# Patient Record
Sex: Male | Born: 1975 | Hispanic: No | Marital: Married
Health system: Southern US, Community
[De-identification: ages and names within clinical notes are randomized; demographics above are authoritative.]

---

## 2000-02-22 ENCOUNTER — Emergency Department (HOSPITAL_COMMUNITY): Admission: EM | Admit: 2000-02-22 | Discharge: 2000-02-22 | Payer: Self-pay | Admitting: Emergency Medicine

## 2000-02-22 ENCOUNTER — Encounter: Payer: Self-pay | Admitting: Emergency Medicine

## 2000-03-01 ENCOUNTER — Emergency Department (HOSPITAL_COMMUNITY): Admission: EM | Admit: 2000-03-01 | Discharge: 2000-03-01 | Payer: Self-pay | Admitting: Emergency Medicine

## 2008-02-23 ENCOUNTER — Inpatient Hospital Stay (HOSPITAL_COMMUNITY): Admission: EM | Admit: 2008-02-23 | Discharge: 2008-02-26 | Payer: Self-pay | Admitting: Emergency Medicine

## 2009-05-28 IMAGING — CR DG WRIST COMPLETE 3+V*R*
3 series · 3 of 3 positions shown · non-contrast
Comparison: No priors

CLINICAL DATA: Multiple trauma - right wrist pain

RIGHT WRIST - COMPLETE 3+ VIEW

[view not recorded (1 of 3)]
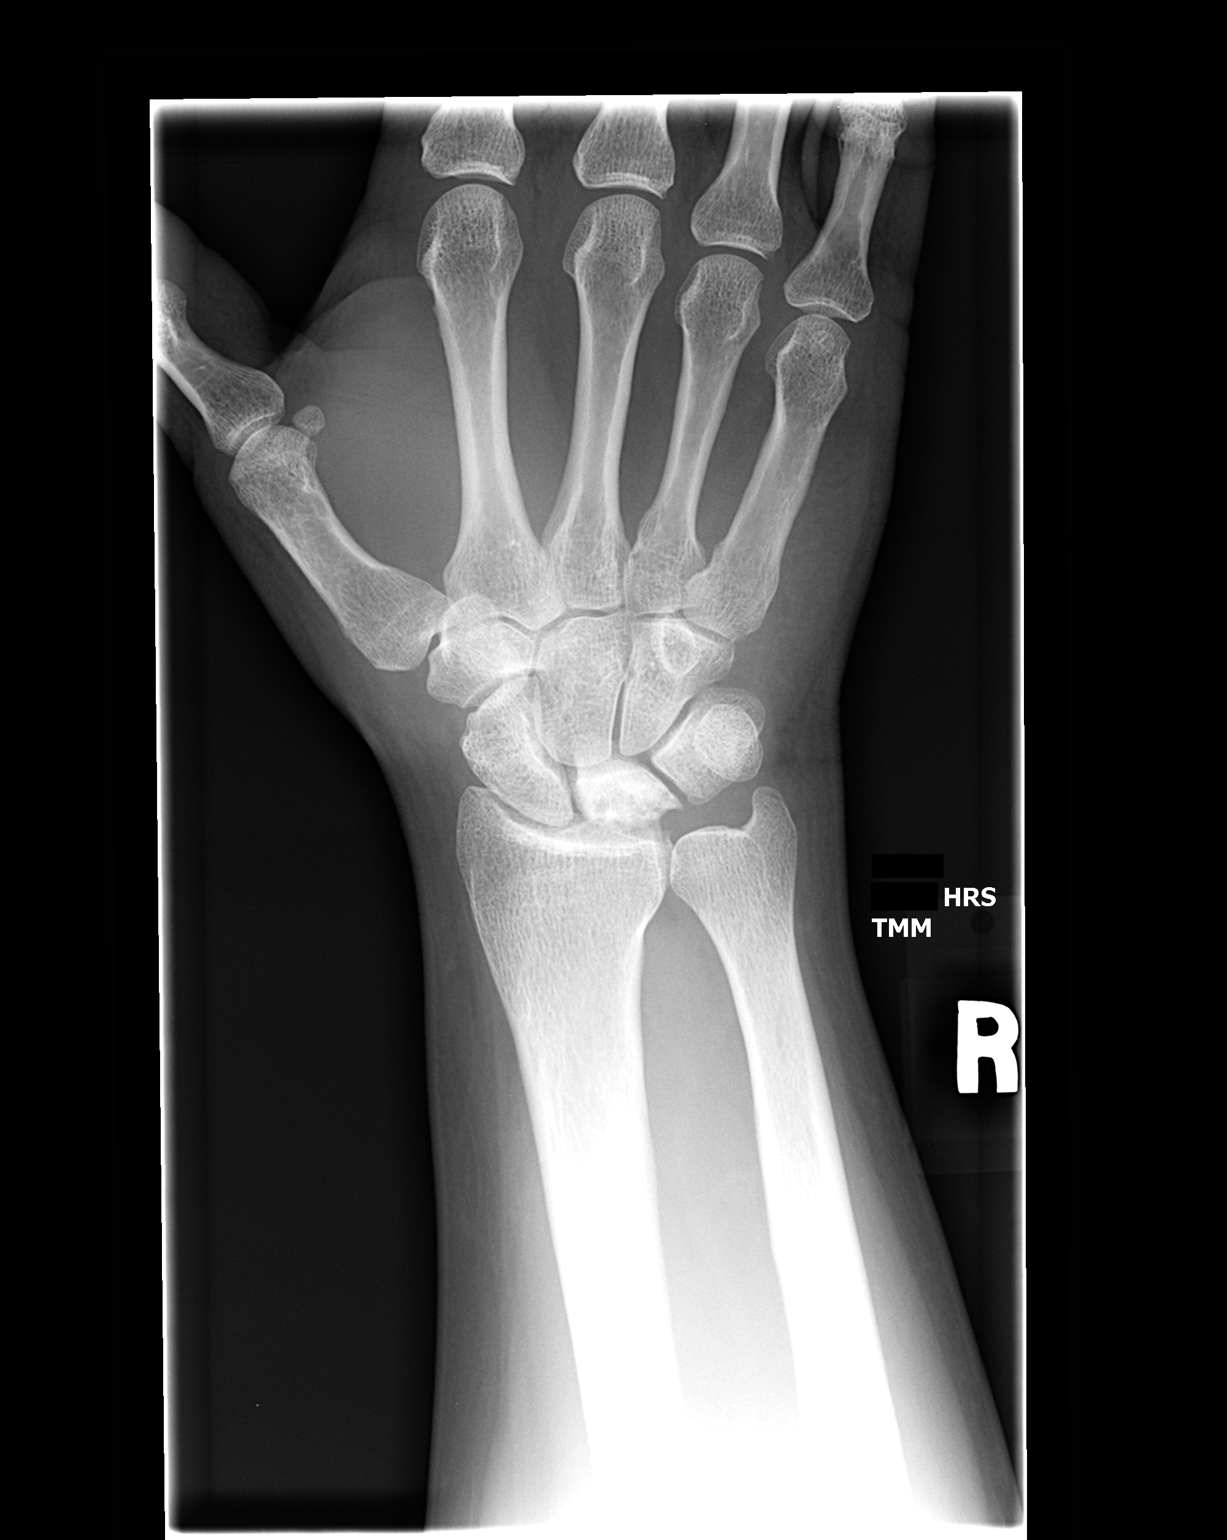

[view not recorded (2 of 3)]
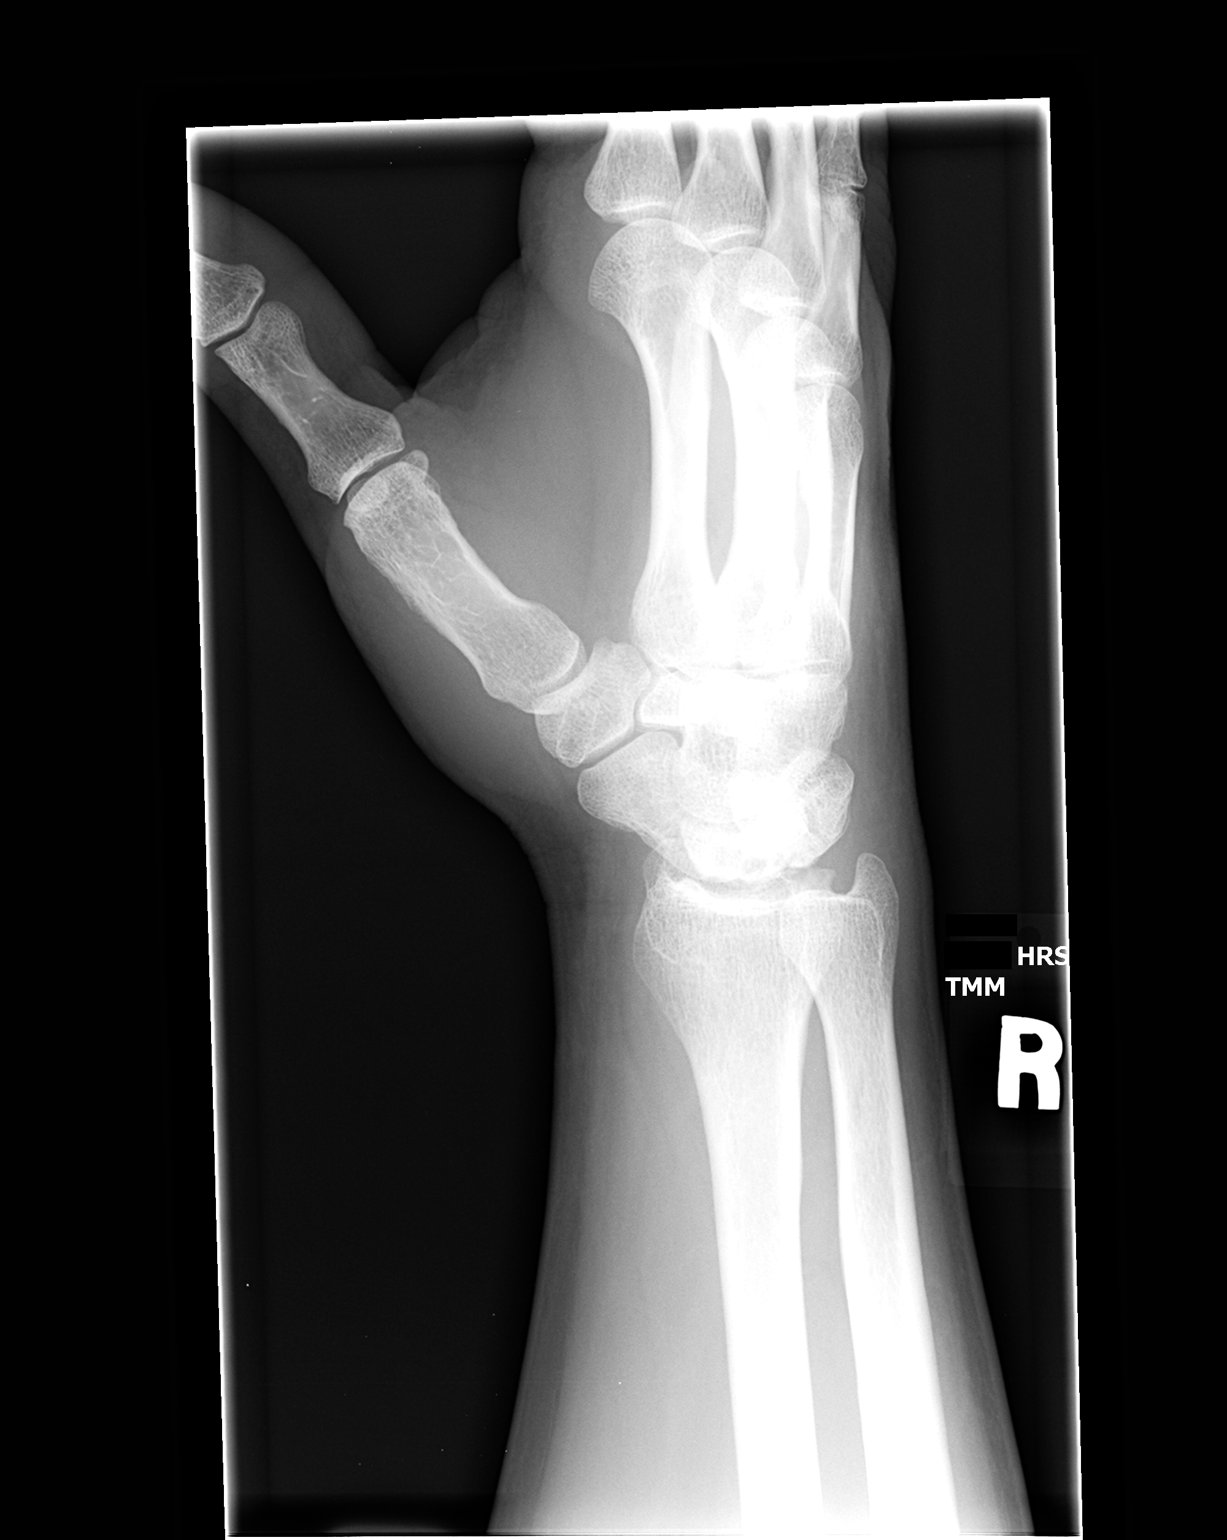

[view not recorded (3 of 3)]
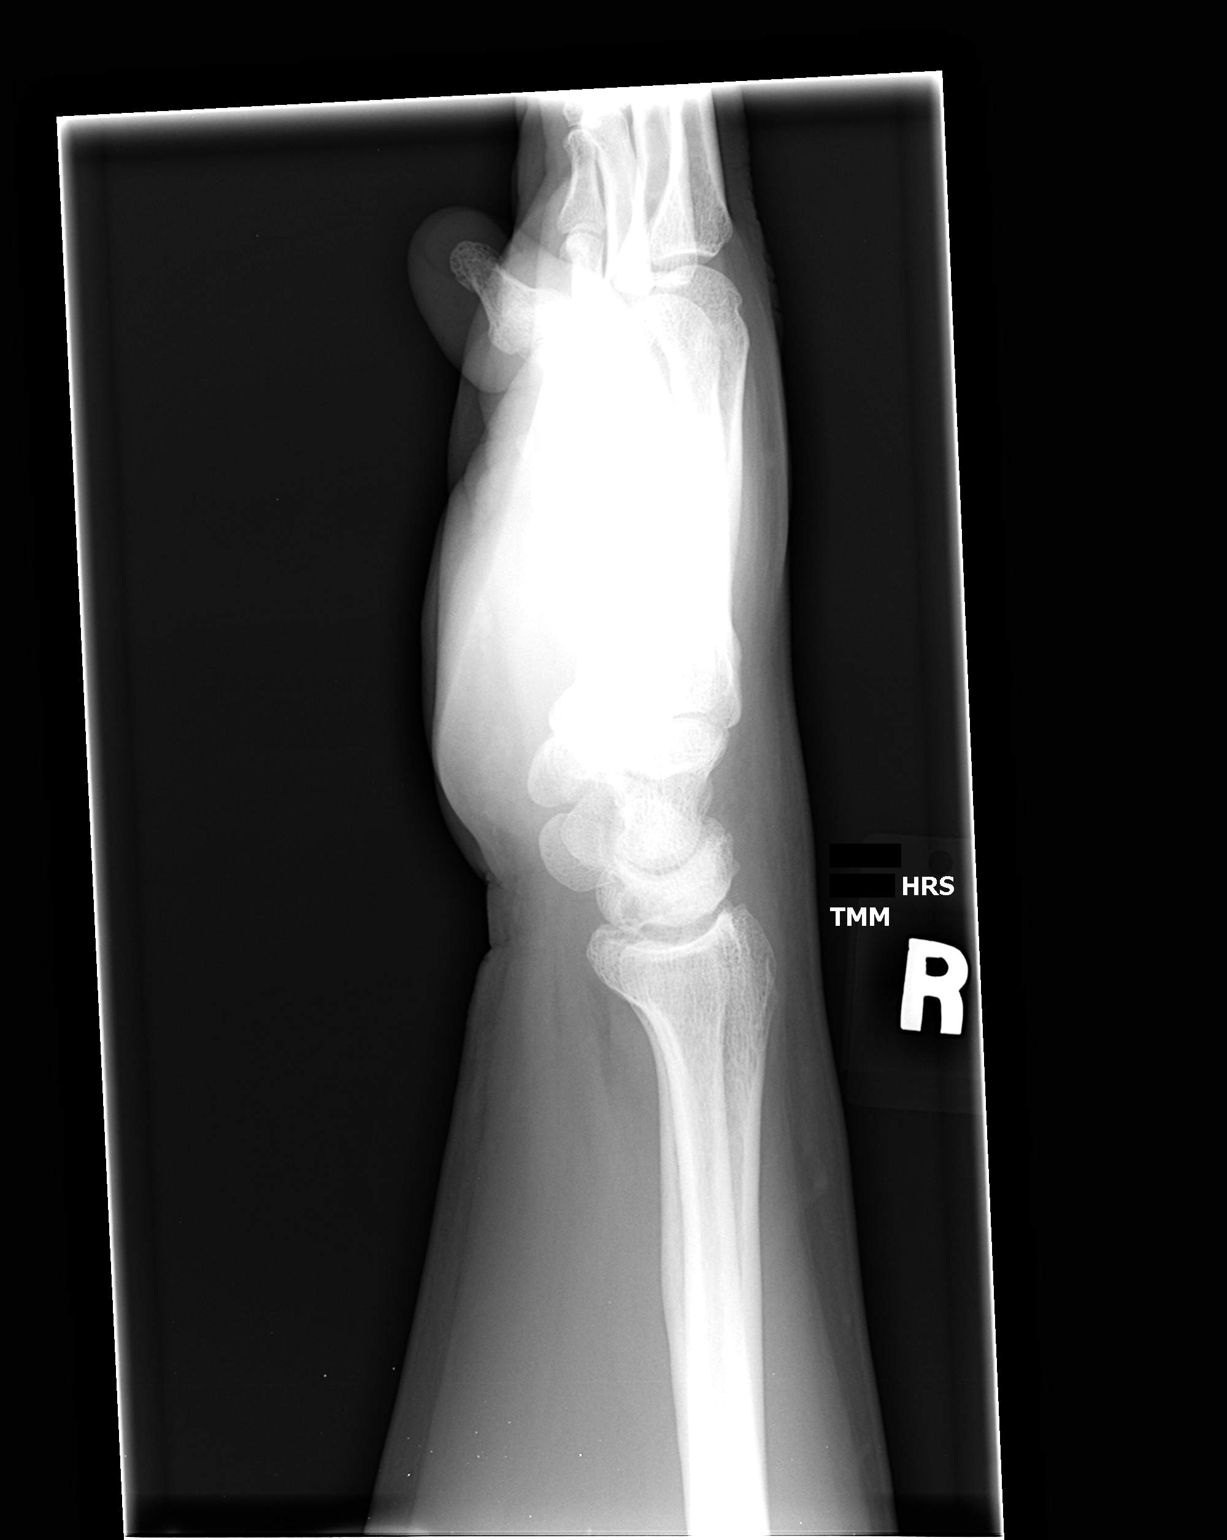

[3 of 3 positions shown; findings below may reference images not displayed]

FINDINGS: Three views done with portable apparatus show no fracture
or dislocation.  Soft tissues unremarkable.
IMPRESSION: No acute findings.

## 2010-10-20 NOTE — Discharge Summary (Signed)
NAME:  Joe Huber, Joe Huber ACCOUNT NO.:  [id number]   MEDICAL RECORD NO.:  [id number]          PATIENT TYPE:  INP   LOCATION:  3022                         FACILITY:  [facility]   PHYSICIAN:  Cherylynn Ridges, M.D.    DATE OF BIRTH:  1975/06/24   DATE OF ADMISSION:  02/23/2008  DATE OF DISCHARGE:  02/26/2008                               DISCHARGE SUMMARY   ADMITTING TRAUMA SURGEON:  Marta Lamas. Lindie Spruce, MD   CONSULTANT:  Suzanna Obey, MD   DISCHARGE DIAGNOSES:  1. Fall of approximately 8 feet.  2. Concussion with brief loss of consciousness.  3. Complex scalp and forehead lacerations.  4. Open nasal fracture.  5. Dental fractures.  6. Thoracolumbar strain.  7. Cervical strain.  8. Acute blood loss anemia.  9. Tachycardia, resolved.   PROCEDURES:  Closure complex scalp and forehead lacerations  approximately 18-cm total on February 23, 2008, Dr. Lindie Spruce.   HISTORY ON ADMISSION:  This is an otherwise healthy 35 year old Hispanic  male who was working on some industrial equipment when he slipped and  fell directly upon his head.  He had a brief loss of consciousness.  He  had significant scalp and forehead lacerations.  He was initially Silver  trauma alert.  Head CT scan was done and showed no intracranial  abnormalities.  He did have a left frontotemporal scalp, hematoma noted.  C-spine CT scan was negative for acute injury.  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  Maxillofacial  CT scan was negative except for nasal bone fracture.   The patient was admitted for observation pain control and mobilization.  He did have a significant blood loss from his scalp laceration, which  was quite complex in nature and actually had some small arterial  bleeders which was found to be addressed during his wound closure.  Once  the wound was closed, he stabilized following this in addition to some  volume resuscitation.  He did have a small laceration over his  nose and  it was felt to be a fracture overlying and he was seen in consultation  per Dr. Suzanna Obey for evaluation of this and will need to follow up  with Dr. Jearld Fenton after his discharge.  He was mobilized with therapy,  initially appeared quite postconcussive and was having difficulty with  dizziness.  He also complained of hand and wrist pain on the right and  radiographs were obtained and were negative for acute abnormalities.  He  continued to have some complaints of shoulder and low back pain as well  as some mid back pain and this all gradually improved to the point where  the patient was able to be independent with ambulation and it was felt  that he should be discharged home.  He was prepared for discharge on  February 26, 2008.   MEDICATIONS AT THE TIME OF DISCHARGE:  1. Flexeril 10 mg p.o. q.8 h. p.r.n. muscle spasms.  2. Vicodin 5/325 mg 1-2 p.o. q.4 h. p.r.n. pain.  3. Colace as needed for constipation.   The patient does need to follow up with Dr. Jearld Fenton.  He will follow up in  [facility] on March 04, 2008, or sooner should he have any  difficulties in the interim or suture removal.      Shawn Rayburn, P.A.      Cherylynn Ridges, M.D.  Electronically Signed    SR/MEDQ  D:  03/05/2008  T:  03/06/2008  Job:  [id number]

## 2011-03-05 LAB — POCT I-STAT, CHEM 8
BUN: 16
HCT: 44
Hemoglobin: 15
Sodium: 142
TCO2: 26

## 2011-03-05 LAB — CBC
Hemoglobin: 12.2 — ABNORMAL LOW
Hemoglobin: 14.6
MCHC: 34.1
MCHC: 35.3
MCV: 91.4
Platelets: 301
Platelets: 315
RBC: 3.56 — ABNORMAL LOW
RBC: 3.86 — ABNORMAL LOW
RDW: 12.2
RDW: 12.6
RDW: 12.7
WBC: 16.8 — ABNORMAL HIGH

## 2011-03-05 LAB — BASIC METABOLIC PANEL
BUN: 8
CO2: 26
CO2: 27
Calcium: 8.4
Chloride: 101
Creatinine, Ser: 0.86
GFR calc Af Amer: >60
GFR calc non Af Amer: >60
Glucose, Bld: 110 — ABNORMAL HIGH
Sodium: 135

## 2011-03-05 LAB — PROTIME-INR
INR: 1
Prothrombin Time: 13.3

## 2015-06-10 ENCOUNTER — Telehealth: Payer: Self-pay | Admitting: *Deleted

## 2015-06-10 DIAGNOSIS — Z20818 Contact with and (suspected) exposure to other bacterial communicable diseases: Secondary | ICD-10-CM

## 2015-06-10 MED ORDER — AZITHROMYCIN 250 MG PO TABS: ORAL_TABLET | ORAL | Status: AC

## 2015-06-10 NOTE — Telephone Encounter (Signed)
Patient's son has pertussis. I am prescribing a course of prophylactic azithromycin to patient.
